# Patient Record
Sex: Female | Born: 1935 | Race: Black or African American | Hispanic: No
Health system: Southern US, Community
[De-identification: ages and names within clinical notes are randomized; demographics above are authoritative.]

---

## 2010-01-24 ENCOUNTER — Inpatient Hospital Stay: Payer: Self-pay | Admitting: Internal Medicine

## 2010-02-06 ENCOUNTER — Emergency Department: Payer: Self-pay | Admitting: Emergency Medicine

## 2010-02-20 ENCOUNTER — Ambulatory Visit: Payer: Self-pay | Admitting: Internal Medicine

## 2010-08-22 ENCOUNTER — Inpatient Hospital Stay: Payer: Self-pay | Admitting: Family Medicine

## 2011-02-01 ENCOUNTER — Inpatient Hospital Stay: Payer: Self-pay | Admitting: Family Medicine

## 2014-06-10 ENCOUNTER — Ambulatory Visit: Payer: Self-pay | Admitting: Family Medicine

## 2014-07-06 ENCOUNTER — Inpatient Hospital Stay: Payer: Self-pay | Admitting: Internal Medicine

## 2014-07-06 LAB — URINALYSIS, COMPLETE
BACTERIA: NONE SEEN
Bilirubin,UR: NEGATIVE
GLUCOSE, UR: NEGATIVE mg/dL (ref 0–75)
Ketone: NEGATIVE
LEUKOCYTE ESTERASE: NEGATIVE
NITRITE: NEGATIVE
PROTEIN: NEGATIVE
Ph: 6 (ref 4.5–8.0)
RBC,UR: 146 /HPF (ref 0–5)
SPECIFIC GRAVITY: 1.015 (ref 1.003–1.030)
Squamous Epithelial: 2
WBC UR: 19 /HPF (ref 0–5)

## 2014-07-06 LAB — COMPREHENSIVE METABOLIC PANEL
AST: 137 U/L — AB (ref 15–37)
Albumin: 2.3 g/dL — ABNORMAL LOW (ref 3.4–5.0)
Alkaline Phosphatase: 139 U/L — ABNORMAL HIGH
Anion Gap: 4 — ABNORMAL LOW (ref 7–16)
BUN: 8 mg/dL (ref 7–18)
Bilirubin,Total: 1.5 mg/dL — ABNORMAL HIGH (ref 0.2–1.0)
CALCIUM: 8.3 mg/dL — AB (ref 8.5–10.1)
Chloride: 99 mmol/L (ref 98–107)
Co2: 38 mmol/L — ABNORMAL HIGH (ref 21–32)
Creatinine: 0.42 mg/dL — ABNORMAL LOW (ref 0.60–1.30)
EGFR (African American): >60
EGFR (Non-African Amer.): >60
Glucose: 109 mg/dL — ABNORMAL HIGH (ref 65–99)
OSMOLALITY: 280 (ref 275–301)
POTASSIUM: 2.7 mmol/L — AB (ref 3.5–5.1)
SGPT (ALT): 81 U/L — ABNORMAL HIGH
Sodium: 141 mmol/L (ref 136–145)
Total Protein: 6.4 g/dL (ref 6.4–8.2)

## 2014-07-06 LAB — CBC
HCT: 40.3 % (ref 35.0–47.0)
HGB: 13.1 g/dL (ref 12.0–16.0)
MCH: 31.7 pg (ref 26.0–34.0)
MCHC: 32.4 g/dL (ref 32.0–36.0)
MCV: 98 fL (ref 80–100)
PLATELETS: 211 10*3/uL (ref 150–440)
RBC: 4.12 10*6/uL (ref 3.80–5.20)
RDW: 13.9 % (ref 11.5–14.5)
WBC: 10.8 10*3/uL (ref 3.6–11.0)

## 2014-07-06 LAB — LIPASE, BLOOD: Lipase: 36 U/L — ABNORMAL LOW (ref 73–393)

## 2014-07-06 LAB — TROPONIN I

## 2014-07-07 LAB — COMPREHENSIVE METABOLIC PANEL
ALK PHOS: 116 U/L
ALT: 62 U/L
Albumin: 1.9 g/dL — ABNORMAL LOW (ref 3.4–5.0)
Anion Gap: 2 — ABNORMAL LOW (ref 7–16)
BUN: 9 mg/dL (ref 7–18)
Bilirubin,Total: 1.2 mg/dL — ABNORMAL HIGH (ref 0.2–1.0)
Calcium, Total: 7.8 mg/dL — ABNORMAL LOW (ref 8.5–10.1)
Chloride: 100 mmol/L (ref 98–107)
Co2: 38 mmol/L — ABNORMAL HIGH (ref 21–32)
Creatinine: 0.37 mg/dL — ABNORMAL LOW (ref 0.60–1.30)
EGFR (African American): >60
EGFR (Non-African Amer.): >60
Glucose: 96 mg/dL (ref 65–99)
OSMOLALITY: 278 (ref 275–301)
Potassium: 3.6 mmol/L (ref 3.5–5.1)
SGOT(AST): 81 U/L — ABNORMAL HIGH (ref 15–37)
SODIUM: 140 mmol/L (ref 136–145)
Total Protein: 5.7 g/dL — ABNORMAL LOW (ref 6.4–8.2)

## 2014-07-07 LAB — CBC WITH DIFFERENTIAL/PLATELET
BASOS ABS: 0 10*3/uL (ref 0.0–0.1)
Basophil %: 0.2 %
Eosinophil #: 0.1 10*3/uL (ref 0.0–0.7)
Eosinophil %: 0.3 %
HCT: 36.6 % (ref 35.0–47.0)
HGB: 12 g/dL (ref 12.0–16.0)
Lymphocyte #: 1.6 10*3/uL (ref 1.0–3.6)
Lymphocyte %: 8.5 %
MCH: 31.7 pg (ref 26.0–34.0)
MCHC: 32.8 g/dL (ref 32.0–36.0)
MCV: 97 fL (ref 80–100)
MONO ABS: 1.2 x10 3/mm — AB (ref 0.2–0.9)
Monocyte %: 6.6 %
NEUTROS ABS: 15.6 10*3/uL — AB (ref 1.4–6.5)
NEUTROS PCT: 84.4 %
Platelet: 190 10*3/uL (ref 150–440)
RBC: 3.78 10*6/uL — ABNORMAL LOW (ref 3.80–5.20)
RDW: 14 % (ref 11.5–14.5)
WBC: 18.5 10*3/uL — ABNORMAL HIGH (ref 3.6–11.0)

## 2014-07-08 LAB — CBC WITH DIFFERENTIAL/PLATELET
BASOS ABS: 0 10*3/uL (ref 0.0–0.1)
Basophil %: 0.3 %
EOS PCT: 3 %
Eosinophil #: 0.4 10*3/uL (ref 0.0–0.7)
HCT: 34 % — ABNORMAL LOW (ref 35.0–47.0)
HGB: 11 g/dL — ABNORMAL LOW (ref 12.0–16.0)
Lymphocyte #: 2.3 10*3/uL (ref 1.0–3.6)
Lymphocyte %: 19 %
MCH: 31.8 pg (ref 26.0–34.0)
MCHC: 32.5 g/dL (ref 32.0–36.0)
MCV: 98 fL (ref 80–100)
Monocyte #: 0.9 x10 3/mm (ref 0.2–0.9)
Monocyte %: 7.3 %
NEUTROS ABS: 8.4 10*3/uL — AB (ref 1.4–6.5)
NEUTROS PCT: 70.4 %
Platelet: 177 10*3/uL (ref 150–440)
RBC: 3.47 10*6/uL — ABNORMAL LOW (ref 3.80–5.20)
RDW: 13.9 % (ref 11.5–14.5)
WBC: 11.9 10*3/uL — AB (ref 3.6–11.0)

## 2014-07-08 LAB — URINE CULTURE

## 2014-07-09 LAB — CBC WITH DIFFERENTIAL/PLATELET
BASOS ABS: 0.1 10*3/uL (ref 0.0–0.1)
Basophil %: 0.6 %
Eosinophil #: 0.4 10*3/uL (ref 0.0–0.7)
Eosinophil %: 4.9 %
HCT: 33.9 % — ABNORMAL LOW (ref 35.0–47.0)
HGB: 11.1 g/dL — AB (ref 12.0–16.0)
LYMPHS ABS: 2.1 10*3/uL (ref 1.0–3.6)
Lymphocyte %: 25.1 %
MCH: 32.3 pg (ref 26.0–34.0)
MCHC: 32.9 g/dL (ref 32.0–36.0)
MCV: 98 fL (ref 80–100)
MONO ABS: 0.8 x10 3/mm (ref 0.2–0.9)
Monocyte %: 9.4 %
NEUTROS ABS: 5.1 10*3/uL (ref 1.4–6.5)
Neutrophil %: 60 %
Platelet: 166 10*3/uL (ref 150–440)
RBC: 3.45 10*6/uL — ABNORMAL LOW (ref 3.80–5.20)
RDW: 14 % (ref 11.5–14.5)
WBC: 8.4 10*3/uL (ref 3.6–11.0)

## 2014-07-10 LAB — CULTURE, BLOOD (SINGLE)

## 2014-07-11 LAB — CULTURE, BLOOD (SINGLE)

## 2014-07-13 LAB — THROAT CULTURE

## 2014-08-10 ENCOUNTER — Ambulatory Visit: Payer: Self-pay | Admitting: Family Medicine

## 2014-11-09 DEATH — deceased

## 2014-12-01 NOTE — Discharge Summary (Signed)
PATIENT NAME:  Jennifer Bautista, Jennifer Bautista MR#:  [id number] DATE OF BIRTH:  01-May-1930  DATE OF ADMISSION:  07/06/2014 DATE OF DISCHARGE:  07/10/2014  ADMITTING DIAGNOSIS:  Acute gastritis.   DISCHARGE DIAGNOSES: 1.  Suspected acute gastroenteritis of unclear etiology.  2.  Sepsis due to Escherichia coli and Klebsiella species.  3.  Questionable right lower lobe pneumonia, suspected aspiration.  4.  Acute respiratory failure, resolved.  5.  Left breast cancer with metastasis.  6.  Hypotension.  7.  Gastroesophageal reflux disease.  8.  History of morbid obesity with associated nausea and associated skin damage under breasts and pannus.   9.  Denuded skin and excoriations on sacrum, left posterior thigh, and mid lower back.   DISCHARGE CONDITION:  Stable.   DISCHARGE MEDICATIONS:  The patient is to continue omeprazole 10 mg p.o. daily, Keflex 500 mg every 8 hours for 12 more days to complete a 14-day course, benzocaine/menthol topical lozenges every 2 hours as needed. The patient is not to take metoprolol or aspirin unless recommended by primary care physician.   HOME OXYGEN:  None.   DIET:  A 2-gram salt, low-fat, low-cholesterol, low-calorie diet. The patient was advised to avoid soft drinks or Gatorade, as all of those drinks have quite a lot of glucose sugars. Diet consistency:  Mechanical soft with thin liquids and aspiration precautions. The patient was advised to sit fully upright with any oral intake and eat and drink slowly small single bites and sips. Medications should be given in puree as necessary for easier swallowing.   ACTIVITY LIMITATIONS:  As tolerated.   REFERRAL:  To hospice at home.    FOLLOWUP APPOINTMENTS:  With [facility] medical director in 1 to 2 days after discharge.   CONSULTANTS:  Care management, social work, wound care, Dr. Sampson Goon.   RADIOLOGIC STUDIES:  Chest x-ray, portable, single view, on 07/06/2014, revealed right perihilar infiltrate versus mass.  Followup chest x-ray to demonstrate right perihilar fullness, concerning for underlying mass or infiltrate, also pulmonary vasculr congestion.   Ultrasound of abdomen, limited survey, on 07/06/2014, showed post cholecystectomy. No evidence of biliary dilatation.   Chest x-ray, portable, single view, on 07/09/2014, revealed right perihilar fullness, may simply represent prominence of the vascular structures in the setting of low inspiratory volumes. Underlying mass or perihilar infiltrate was not excluded. Dedicated PA and lateral chest x-ray may be helpful in further evaluation. Atherosclerotic and tortuous thoracic aorta, low inspiratory volumes, and pulmonary vascular congestion without overt edema were noted.   CT of the chest with contrast on 07/09/2014, revealed a large mass in the anterior aspect of the left breast, not fully imaged on the study. Overlying marked skin thickening reflecting inflammatory change. This is compatible with primary breast malignancy. Mild soft tissue inflammation extends to the midline anterior chest wall. Additional scattered nodules more superiorly within the left breast are concerning for local spread of malignancy. Significantly enlarged and heterogeneous nodal metastasis in the left axilla measuring 5.2 x 3.8 cm with associated calcification and irregularity. Partially calcified nodes in the right axilla measuring up to 2.3 cm and short axis suspicious for contralateral nodal metastasis. Scattered bilateral pulmonary nodules were seen measuring up to 1.3 cm in size, suspicious for metastatic disease in the lungs. Right hilar prominence reflects an enlarged 1.6 cm right hilar node. Additional enlarged mediastinal nodes were seen, also suspicious for metastatic disease. Focal bronchiectasis in the right lower lung lobe with mild associated scarring. This may reflect sequelae of prior infection. Mild  bibasilar atelectasis was noted. Scattered coronary artery calcification was  noted. Nonspecific soft tissue inflammation along the right lateral chest wall. Scattered small, nonobstructing bilateral renal stones measuring up to 4 mm in size. A 5 cm left renal cyst was seen. Scattered diverticulosis along the splenic flexure of the colon.   Bilateral lower extremity Dopplers on 07/10/2014, showed no evidence of deep vein thrombosis bilaterally.   HOSPITAL COURSE:  The patient is an 81 year old African-American female with a history of breast carcinoma, who refused care in the past, who presented to the hospital with complaints of nausea, vomiting, and abdominal pain of right upper quadrant. Please refer to Dr. Rob Hickman admission on 07/06/2014. Apparently, the patient had been sick over the past 2 days with nausea, vomiting, and abdominal pain. She was brought to the Emergency Room. On arrival to the Emergency Room, she was hypoxic with oxygen saturation of 80%. She was complaining of right upper quadrant abdominal pain; however, no abnormalities were noted. Temperature was found to be 99.6, pulse 100, respiration rate 18 to 22, blood pressure 110/72, and pulse oximetry was 95% on 2 liters of oxygen through nasal cannula. Physical exam revealed left breast mass. Right upper quadrant tenderness was noted. Crackles on the right side were also noted on lung exam.   The patient's laboratory data done on arrival to the Emergency Room on 07/06/2014, showed a glucose level of 109. The patient's potassium level was low at 2.7. Bicarbonate level was elevated at 38. Otherwise, BMP was unremarkable. The patient's lipase level was found to be low at 36. Liver enzymes were found to be abnormal with albumin level of 2.8, total bilirubin 1.5, alkaline phosphatase 139, AST 137, and ALT of 81. Troponin was normal at less than 0.02. White blood cell count was normal at 10.8, hemoglobin 13.1, and platelet count was 211,000. The patient had 2 blood cultures taken in the Emergency Room, which were positive.  Klebsiella pneumoniae species was found to be in 1 out of 4 bottles, and Escherichia coli was found to be in 2 out of 4 bottles. Bacteria was sensitive to all antibiotics, but Klebsiella was resistant to ampicillin. Urinalysis showed 146 red blood cells and 19 white blood cells, although the patient's urine culture did not show any growth. EKG showed sinus tachycardia at 101 beats per minute, left axis deviation, and inferior as well as anterior infarct with no acute ST-T changes.   The patient was admitted to the hospital for further evaluation. She was initiated on broad-spectrum antibiotic therapy because of concerns of pneumonia. When her blood cultures came back positive, the patient had consultation by Dr. Sampson Goon, infectious disease specialist, on 07/08/2014. Dr. Sampson Goon felt that the patient should have a speech therapist evaluation for swallowing because of concerns of aspiration and right lower lobe infiltrate. He also felt that the patient's presentation could have been related to possible billiary event or that she was bacteremic from pneumonia. He recommended continuing antibiotic therapy and also check for Clostridium difficile, especially if she has ongoing diarrhea. Also, if she worsens clinically or her LFTs increase again, he recommended getting a CT scan of the abdomen and pelvis, as ultrasound is not an adequate study. Since the patient had no more recurrence of her nausea, vomiting, or diarrheal stool, we were not able to get any stool for Clostridium difficile. We evaluated the patient's swallowing, and dietary recommended aspiration precautions. The patient was initiated on broad-spectrum antibiotics during therapy initially for her sepsis; however, later on, the  patient's antibiotic was changed to Keflex. The patient is to continue Keflex for 12 more days to complete a 14-day course.   On the day of discharge, 07/10/2014, she is afebrile with temperature of 98, pulse 96, respiration  rate 18 to 24, blood pressure fluctuating from 92 to 107 systolic and 50 to 60 diastolic, and oxygen saturations 90 to 93% on room air at rest.   In regards to breast cancer with metastasis, the patient was consulted by palliative care, and since she did not want to have any further investigation and treatment by oncologist, hospice at home was arranged for her upon discharge.   In regards to hypotension, the patient is to hold her blood pressure medications and restart them when her blood pressure is elevated. I suspect the patient's hypotension could have been related to poor p.o. intake as well as some intermittent diarrheal stools.   For gastroesophageal reflux disease as well as history of nausea and vomiting, the patient is to continue omeprazole. I am holding aspirin therapy.   The patient was noted to have candidiasis associated skin damage under her breasts as well as pannus as well as denuded skin and excoriations of sacrum, left posterior thigh, and mid and lower back. She was seen by wound care specialist and recommended to have her skin cleansed under the breasts as well as abdominal pannus with soap and water. Apply therapeutic linen such as pillow cases under breasts and abdominal pannus and change daily. In regards to lesions of the sacrum as well as left posterior thigh and mid and lower back, wound care recommended cleansing lesions with soap and water and pat gently to dry. Apply Allevyn silicone border foam dressing to be reapplied every 5 days and as needed for soiling. The patient will be followed and cared for by hospice and nursing staff.   TIME SPENT:  40 minutes.     ____________________________ Katharina Caperima Lennix Kneisel, MD rv:nb D: 07/10/2014 17:05:20 ET T: 07/11/2014 01:06:06 ET JOB#: [id number]  cc: Katharina Caperima Andi Mahaffy, MD, <Dictator> Zora Glendenning MD ELECTRONICALLY SIGNED 08/07/2014 20:46

## 2014-12-01 NOTE — H&P (Signed)
PATIENT NAME:  Jennifer Bautista, ARAUZ MR#:  [id number] DATE OF BIRTH:  [birth year]  DATE OF ADMISSION:  07/06/2014  PRIMARY CARE PHYSICIAN: Marisue Ivan, MD  REFERRING EMERGENCY ROOM PHYSICIAN: Kathreen Devoid. Paduchowski, MD   CHIEF COMPLAINT: Nausea, vomiting, abdominal pain in the right upper quadrant.   HISTORY OF PRESENT ILLNESS: Patient is an 81 year old African American female brought into the ED by her daughter with a chief complaint of nausea, vomiting, and right upper quadrant abdominal pain. She reports that she has been sick for the past 2 days with the above symptoms. Came into the ED. On room air, the patient was saturating at 80%. She was placed on 2 L of oxygen and her pulse oximetry went up to 95%. Portable chest x-ray has revealed right perihilar infiltrate versus mass. As the patient was complaining of right upper quadrant abdominal pain, ultrasound was done. It shows post cholecystectomy. No evidence of biliary dilatation. During my examination, the patient is resting comfortably. According to the daughter at bedside, she has left-sided breast cancer and not seeking any medical attention regarding that. The patient is bedbound, according to the patient's daughter.   PAST MEDICAL HISTORY: Left-sided breast cancer with infiltration and metastasis to skin. History of hypertension, GERD, obesity. History of hiatal hernia.   PAST SURGICAL HISTORY: Status post cholecystectomy, post hiatal hernia repair.   PSYCHOSOCIAL HISTORY: Lives at home with daughter. No history of smoking, alcohol or illicit drug usage.   FAMILY HISTORY: No history of coronary artery disease or cancer in her family.   HOME MEDICATIONS: Omeprazole 20 mg 1 capsule p.o. once daily, metoprolol tartrate 25 mg 1 capsule p.o. 2 times a day, aspirin 81 mg once daily.  REVIEW OF SYSTEMS: CONSTITUTIONAL: Denies any fever, fatigue. Denies any weight loss. Denies any weakness.  EYES: Denies blurry vision, double vision.  ENT:  Denies epistaxis, discharge, or hearing loss.  RESPIRATORY: Denies any cough, wheezing, or hemoptysis.  CARDIOVASCULAR: Denies any chest pain, orthopnea. Chronic edema. GASTROINTESTINAL: Complaining of nausea, vomiting, and right-sided abdominal pain. GENITOURINARY: No dysuria or hematuria. ENDOCRINE: Denies polyuria, nocturia, or thyroid problems.  GYNECOLOGIC AND BREASTS: Has a history of left-sided breast cancer. HEMATOLOGIC AND LYMPHATIC: Denies any anemia.  INTEGUMENTARY: Denies any rash but her breast cancer has infiltrated to the skin.  MUSCULOSKELETAL: Denies any pain in her neck or back. NEUROLOGIC: Denies any numbness or weakness.  PSYCHIATRIC: No anxiety or insomnia.   PHYSICAL EXAMINATION: VITAL SIGNS: Temperature 99.6, pulse 100, respirations 18 to 22, blood pressure 110/72, pulse oximetry 95% on 2 L.  GENERAL APPEARANCE: Not in acute distress. Morbidly obese.  HEENT: Normocephalic, atraumatic. Pupils are equally reactive to light and accommodation. No scleral icterus. No conjunctival injection. No sinus tenderness. Dry mucous membranes.  NECK: Supple. No JVD. No thyromegaly. Range of motion is intact.  LUNGS: Clear to auscultation bilaterally. No accessory muscle use. Positive crackles on the right side.  CARDIOVASCULAR: S1, S2 normal. Regular rate and rhythm. No murmurs.  GASTROINTESTINAL: Soft. Bowel sounds are positive in all 4 quadrants. Nondistended. Positive right upper quadrant tenderness. No masses felt. Morbidly obese.  NEUROLOGIC: Awake and alert, oriented x 2 to 3. Following verbal commands. Motor and sensory are intact. Reflexes are 2+.  SKIN: Warm to touch. Decreased turgor. No rashes. No lesions. Left breast cancer is infiltrated to the skin with ulcers. Left breast is enormously enlarged with infiltration of breast cancer to the skin with foul-smelling ulcers.  BACK: Skin looks dry with desquamation. No decubitus ulcers noticed.  EXTREMITIES: Trace edema. No  cyanosis, no clubbing.  PSYCHIATRIC: Flat mood and affect.  LABORATORY AND IMAGING STUDIES: Ultrasound of the abdomen status post cholecystectomy. No evidence of biliary dilatation. Chest x-ray, portable: Right perihilar infiltrate versus mass. Followup PA and lateral chest x-ray to demonstrate clearing suggested. Glucose is 109, BUN normal, creatinine 1.42, sodium normal,  chloride 99, CO2 of 38. GFR greater than 60. Anion gap is normal. Serum osmolality 280. Calcium 8.3. Lipase is 36, total protein 6.4, albumin 2.3. Bilirubin total is 1.5. Alkaline phosphatase 139. AST is 137, ALT 81. Troponin less than 0.02. WBC is normal. Hemoglobin, hematocrit, platelet counts are normal.   URINALYSIS: Yellow in color, clear in appearance, nitrites and leukocyte esterase are negative.   ASSESSMENT AND PLAN: An 81 year old African American female brought into the Emergency Department by her daughter for intractable nausea, vomiting, and right upper quadrant abdominal pain for the past 2 days. Her pulse oximetry dropped down to 80% on room air and she was saturating 95% on 2 L. Chest x-ray revealed right-sided pneumonia. 1.  Acute gastritis with right upper quadrant abdominal pain with elevated liver function tests status post cholecystectomy, probably from right-sided pneumonia versus choledocholithiasis other differential diagnosis, but no biliary duct dilatation on ultrasound. We will provide her gastrointestinal prophylaxis. We will provide intravenous fluids, antiemetics. Will get a surgery consult if needed.  2.  Treat pneumonia with antibiotics.  3.  Right-sided pneumonia. Will provide her intravenous levofloxacin. Will get a sputum culture and sensitivity.  4.  Acute hypoxic respiratory failure, probably from right-sided pneumonia. Will provide oxygen supplementation.  5.  Left-sided breast cancer which has metastasized, with skin infiltration. The patient does not want to seek any oncology consult.  6.   Hypertension. Blood pressure is low normal. Will hold off on her medications.  7.  Gastroesophageal reflux disease. Will provide her gastrointestinal prophylaxis.  8.  Morbidly obese.  9.  Chronically bedbound.  10.  We will provide gastrointestinal and deep vein thrombosis prophylaxis.  CODE STATUS: She is do not resuscitate. Daughter is the medical power-of-attorney.  Plan of care discussed in detail with the patient and her daughter at bedside. They both verbalized understanding of the plan.  TOTAL TIME SPENT ON ADMISSION: 50 minutes.  The patient was Dr. Sherryll BurgerLinthavong's patient, but they want to see a new primary care physician and does not want to continue with Dr. Burnadette PopLinthavong in future.   ____________________________ Ramonita LabAruna Myah Guynes, MD ag:ST D: 07/06/2014 22:06:55 ET T: 07/06/2014 22:49:41 ET JOB#: [id number]  cc: Ramonita LabAruna Justyce Yeater, MD, <Dictator> Marisue IvanKanhka Linthavong, MD Ramonita LabARUNA Monee Dembeck MD ELECTRONICALLY SIGNED 07/26/2014 16:47

## 2014-12-05 NOTE — Consult Note (Signed)
PATIENT NAME:  Jennifer Bautista, Jennifer Bautista MR#:  [id number] DATE OF BIRTH:  January 18, 1930  DATE OF CONSULTATION:  07/07/2014  REQUESTING PHYSICIAN:  Dr. Sherryll Burger. CONSULTING PHYSICIAN:  Stann Mainland. Sampson Goon, MD  REASON FOR CONSULTATION: Fever and pneumonia and bacteremia.   HISTORY OF PRESENT ILLNESS: This is an 81 year old female who has a history of breast cancer with metastases to skin not seeking treatment, as well as hypertension, obesity, GERD, and hiatal hernia. She was admitted November 27 with nausea, vomiting, and abdominal pain in her right upper quadrant. Chest x-ray showed possible pneumonia. She was also hypoxic on admission. She did have mild elevation in her LFTs although ultrasound was negative. Since admission, her nausea, vomiting, and diarrhea have improved; however, she has bacteremia in 1 of 2 blood cultures.   PAST MEDICAL HISTORY:  1. Breast cancer with infiltration or metastases to skin.  2. Hypertension.  3. GERD.  4. Morbid obesity.  5. History of hiatal hernia.   PAST SURGICAL HISTORY: Cholecystectomy and hiatal hernia repair.   SOCIAL HISTORY: Lives at home. No tobacco, alcohol, or drugs. She apparently is largely bedbound.   FAMILY HISTORY: Noncontributory.   REVIEW OF SYSTEMS: Eleven systems reviewed and negative except as per history of present illness.   ANTIBIOTICS SINCE ADMISSION: Include levofloxacin begun November 27.   PHYSICAL EXAMINATION:  VITAL SIGNS: Temperature 98.7, pulse 105, blood pressure 117/74, respirations 18, saturation 98% on 2 L.  GENERAL: She is morbidly obese, lying in bed in no acute distress.  HEENT: Pupils equal, round, reactive to light and accommodation. Extraocular movements are intact. Sclerae are anicteric.  OROPHARYNX: Clear.  NECK: Supple.  HEART: Regular.  LUNGS: Have coarse breath sounds bilateral bases.  ABDOMEN: Obese, soft, mild tender to palpation right upper quadrant.  EXTREMITIES: No clubbing, cyanosis, or edema.  NEUROLOGIC:  She is alert and oriented x 3. Grossly nonfocal neurologic exam.   DATA: Micro: Blood cultures 2 of 2 are growing gram-negative cocci bacilli from November 27. Urine culture November 27 is negative. White blood count on admission was 10.8, currently 18.5; hemoglobin 12.0; platelets 190,000. Urinalysis showed 19 white cells. LFTs initially showed alkaline phosphatase 139, down to 116; AST 137, down to 81; ALT 81, down to 62; bilirubin was 1.5 now it is 1.2; total protein 5.7; albumin 1.9. Renal function shows creatinine of 0.37. Lipase was 36. Imaging: Ultrasound of the abdomen was a poor study but reveals gallbladder absent. No evidence of biliary dilation. Chest x-ray showed perihilar infiltrate versus mass on the right.   IMPRESSION: An 81 year old female with history of morbid obesity largely bed admitted with nausea, vomiting, and abdominal pain for 2 days. She then was found to be hypoxic and chest x-ray shows right perihilar infiltrate or mass. Clinically, she is no longer having nausea or vomiting. Chest x-ray shows pneumonia and blood cultures 2 of 2 are positive.   I suspect either she had a biliary event and is bacteremic from that or had some pneumonia. Urinalysis is relatively unimpressive.   RECOMMENDATIONS:  1. Double cover for gram negatives at this point with Zosyn and levofloxacin until further identification of the organism.  2. If she worsens clinically or her LFTs increase again would suggest CT of her abdomen and pelvis, as the ultrasound was a poor study.  3. Consider speak and swallow evaluation to evaluate for possible aspiration given the right lower lobe infiltrate in the setting of recent gastroenteritis and nausea and vomiting.  Thanks for the consult. I will be  glad to follow with you.    ____________________________ Stann Mainlandavid P. Sampson GoonFitzgerald, MD dpf:bm D: 07/07/2014 22:44:42 ET T: 07/07/2014 23:28:46 ET JOB#: [id number]  cc: Stann Mainlandavid P. Sampson GoonFitzgerald, MD, <Dictator> Seichi Kaufhold  Sampson GoonFITZGERALD MD ELECTRONICALLY SIGNED 08/12/2014 21:18

## 2016-01-02 IMAGING — US ABDOMEN ULTRASOUND LIMITED
1 series · 14 of 25 positions shown · non-contrast
Comparison: 02/01/2011

CLINICAL DATA: Right upper quadrant pain.  Post cholecystectomy.

EXAM:
US ABDOMEN LIMITED - RIGHT UPPER QUADRANT

[Series 1: abdomen ultrasound limited · 0.27mm/px · 14 of 25 slices shown]
[im 1/25]
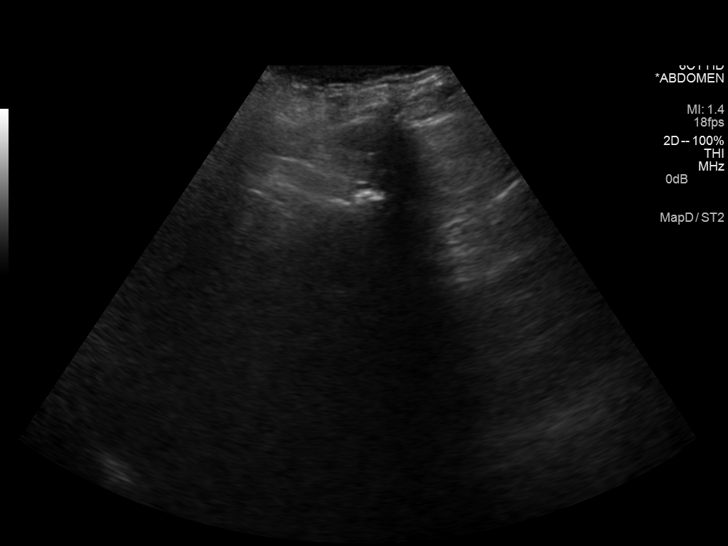
[im 3/25]
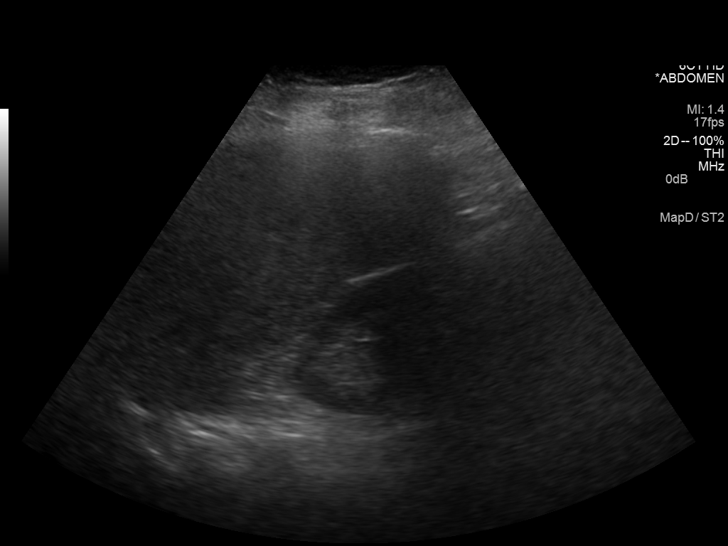
[im 5/25]
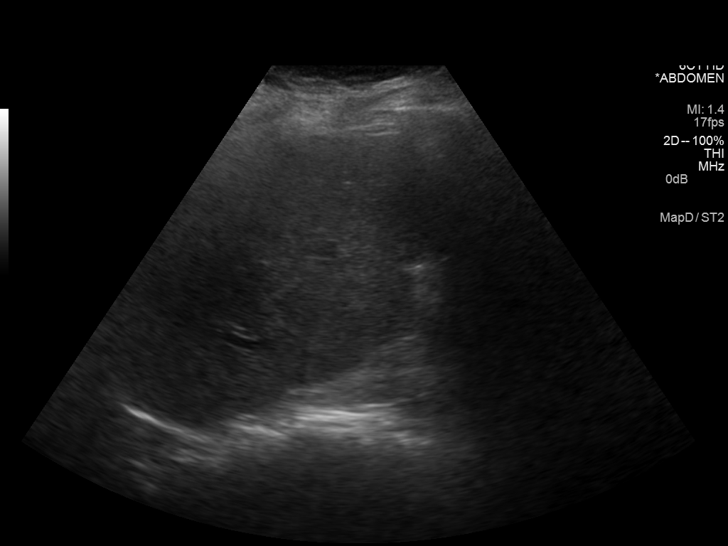
[im 7/25]
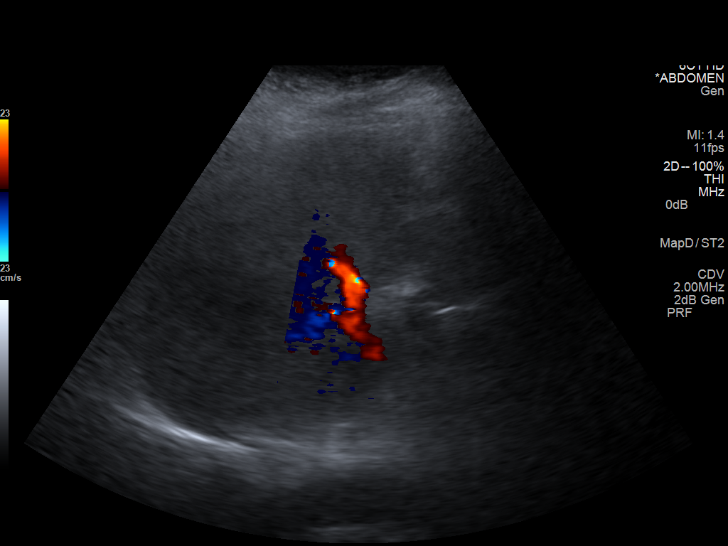
[im 9/25]
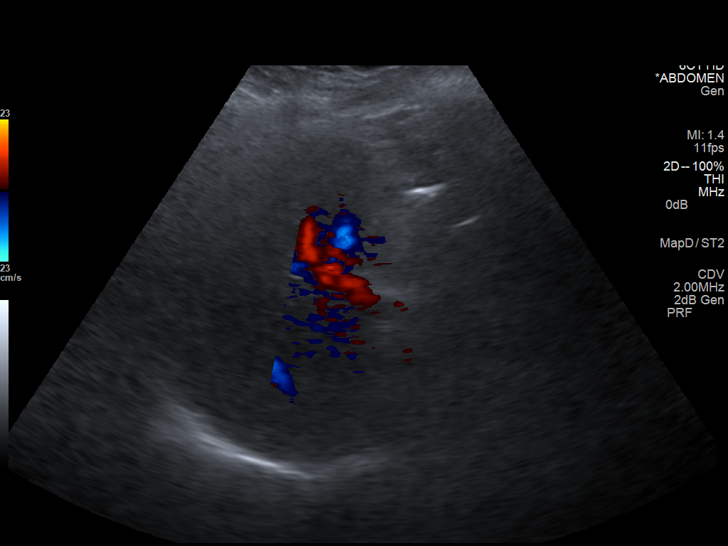
[im 10/25]
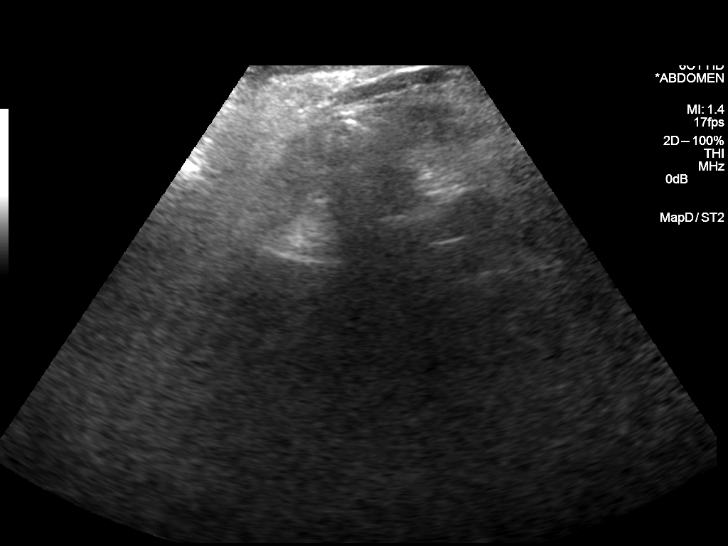
[im 12/25]
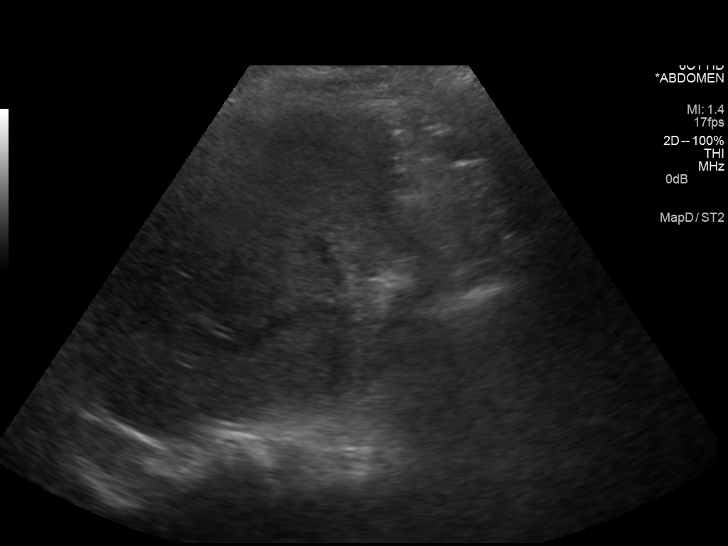
[im 14/25]
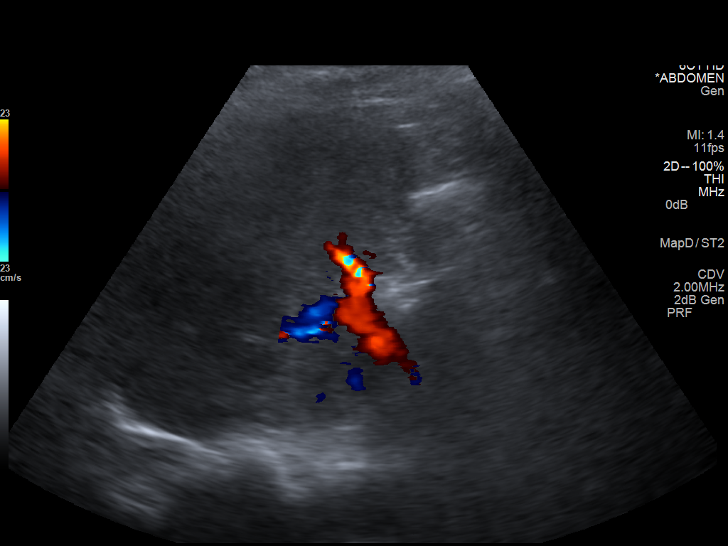
[im 16/25]
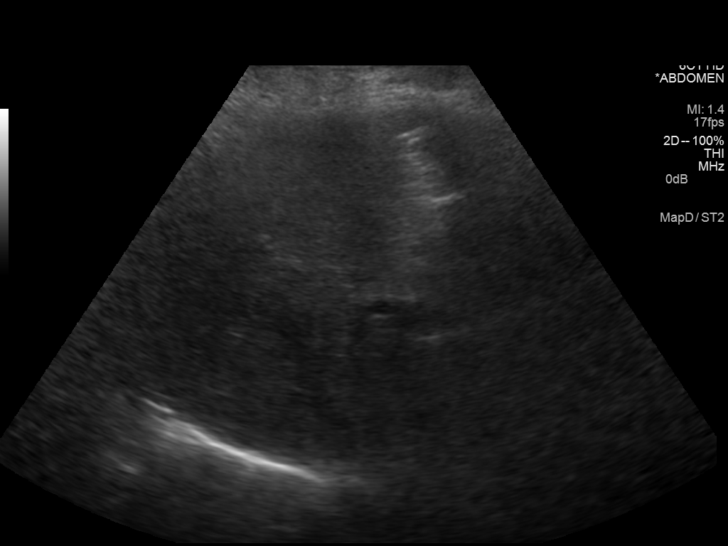
[im 17/25]
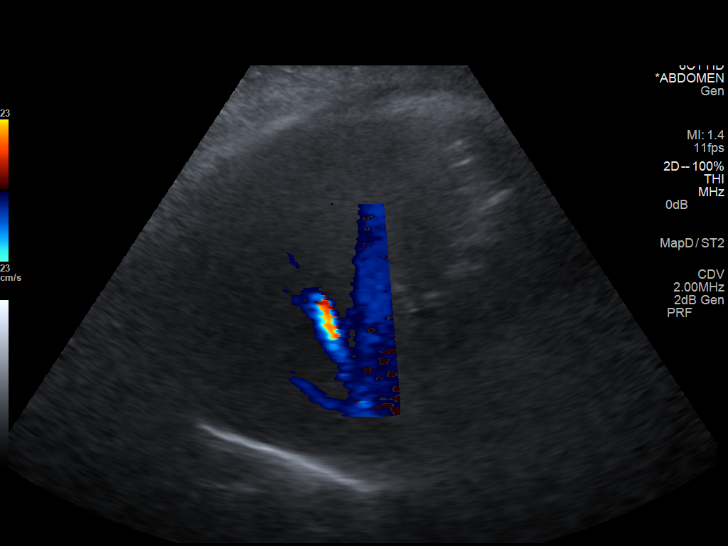
[im 19/25]
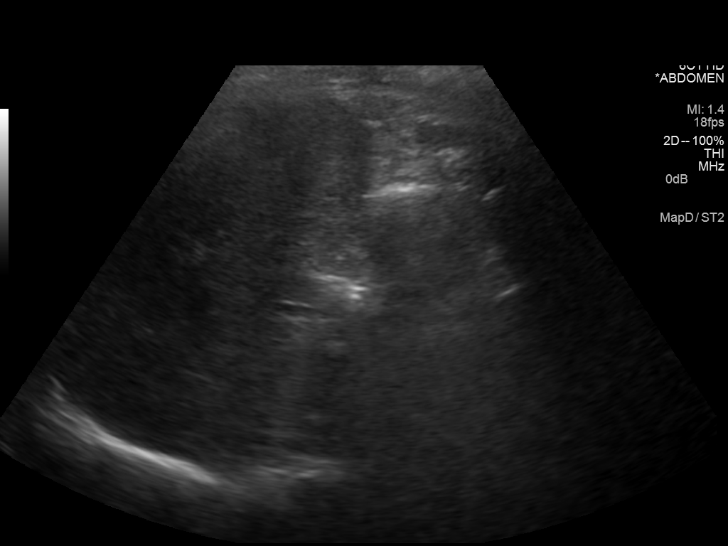
[im 21/25]
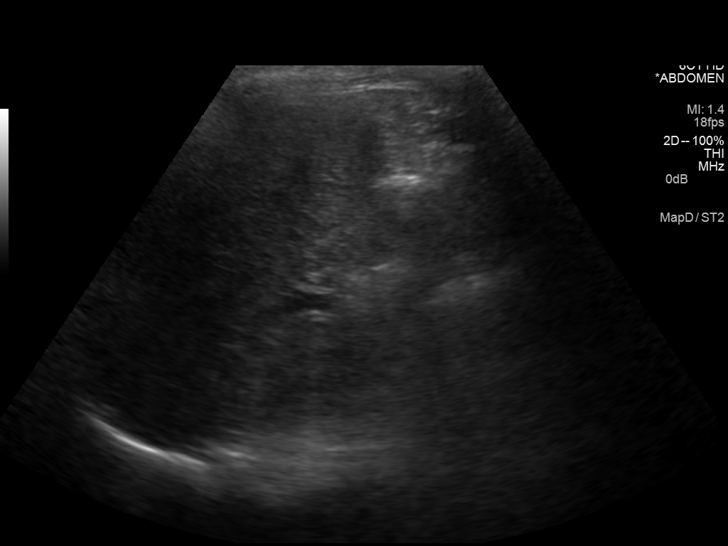
[im 23/25]
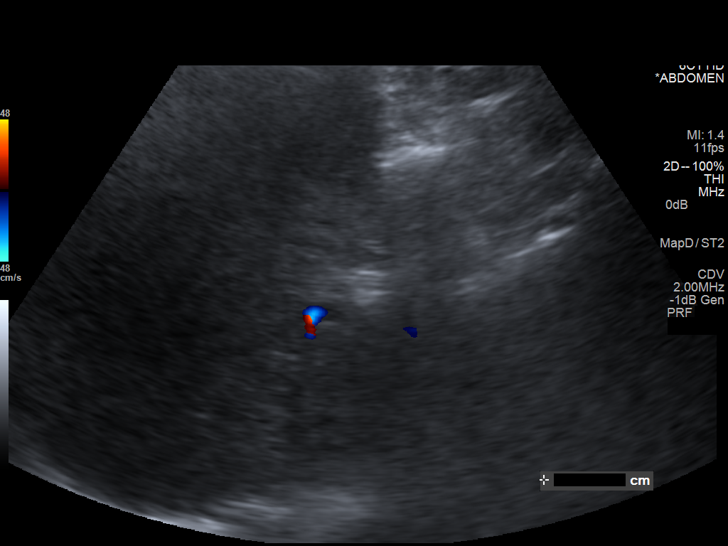
[im 25/25]
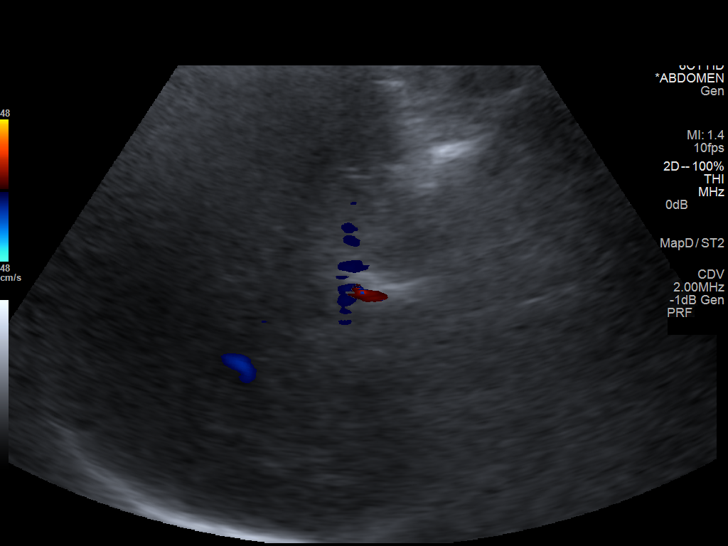

[14 of 25 positions shown; findings below may reference images not displayed]

FINDINGS: Gallbladder:

Surgically removed.

Common bile duct:

Diameter: 5 mm

Liver:

Limited evaluation of the liver parenchyma due to body habitus. No
significant intrahepatic biliary dilatation.
IMPRESSION: Post cholecystectomy.  No evidence for biliary dilatation.

## 2016-01-05 IMAGING — CT CT CHEST W/ CM
2 of 3 series · 13 of 36 positions shown, 16 images · IV contrast (isovue)
Comparison: Chest radiograph performed earlier today at [DATE] p.m.

CLINICAL DATA: Acute onset of nausea, vomiting and abdominal pain
for 2 days. Hypoxia. Right perihilar infiltrate or mass noted on
chest radiograph. Initial encounter.

EXAM:
CT CHEST WITH CONTRAST
TECHNIQUE: Multidetector CT imaging of the chest was performed during
intravenous contrast administration.
CONTRAST:  75 mL of Isovue 300 IV contrast

[Series 4: routine chest with 2 · axial · 0.75mm/px · z∈[-744,-514]mm · 10 of 56 slices shown, 13 images]
[im 5/56  mediastinal]
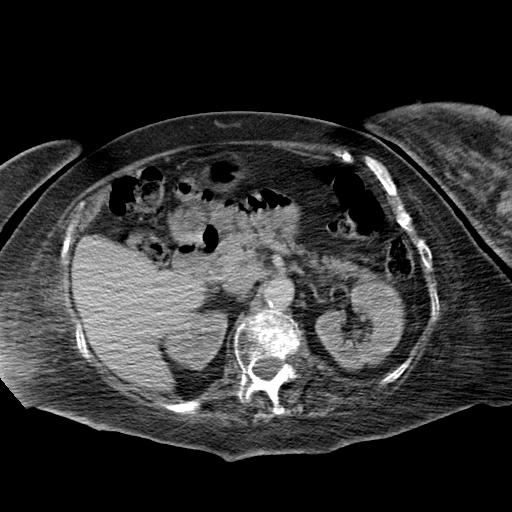
[im 5/56  lung]
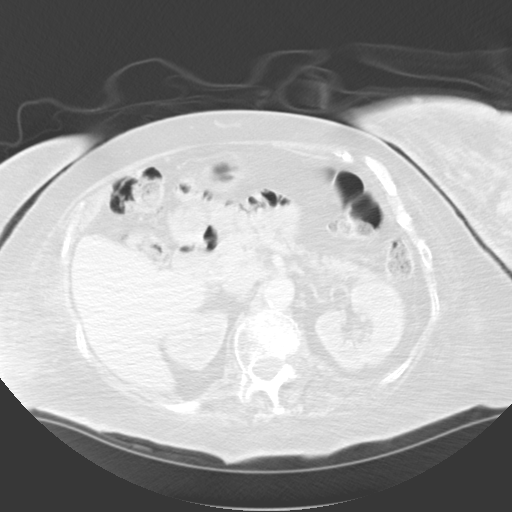
[im 9/56  lung]
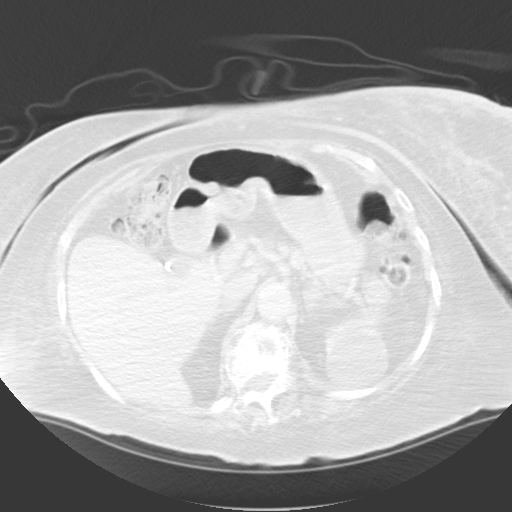
[im 15/56  lung]
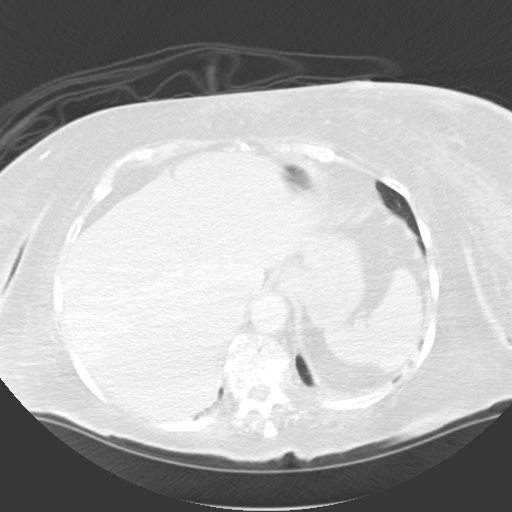
[im 21/56  lung]
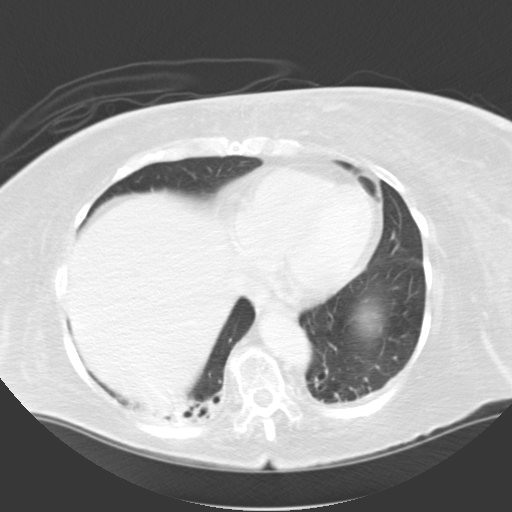
[im 25/56  mediastinal]
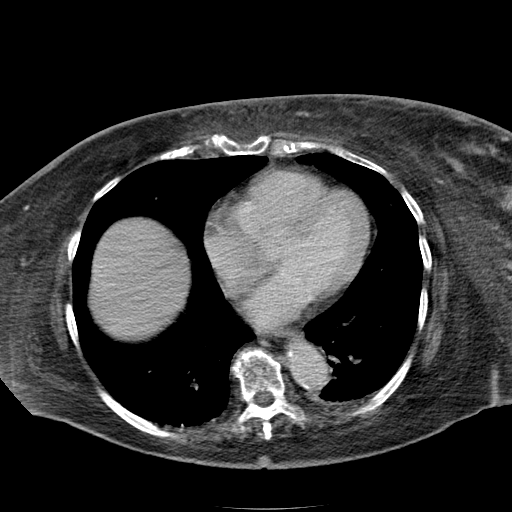
[im 25/56  lung]
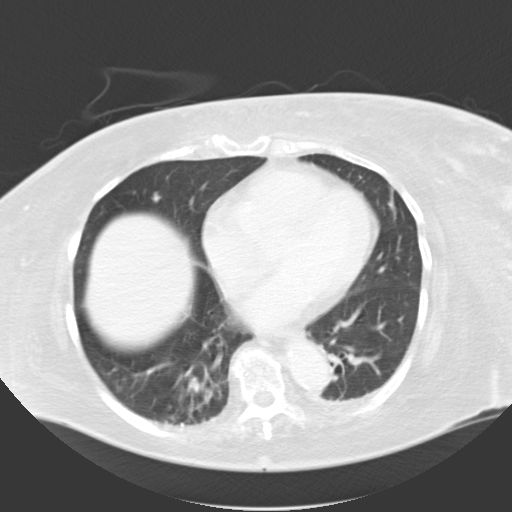
[im 31/56  lung]
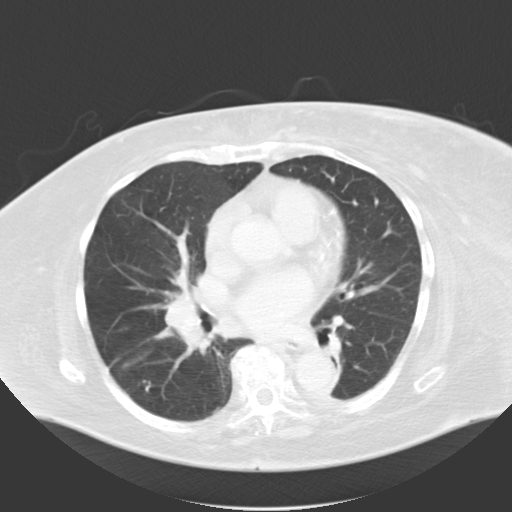
[im 35/56  lung]
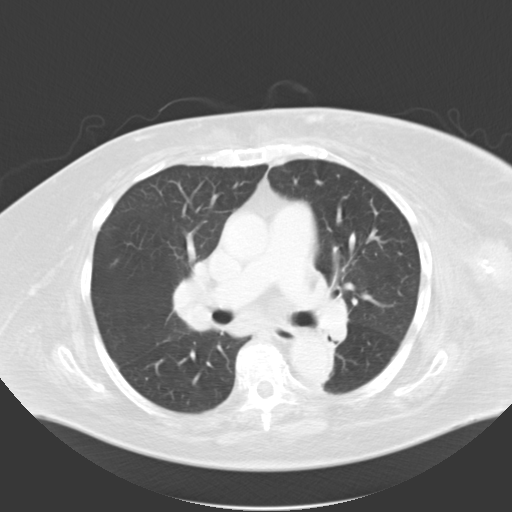
[im 41/56  lung]
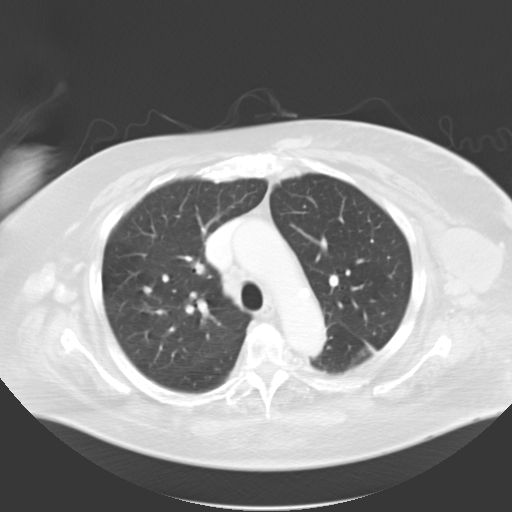
[im 47/56  mediastinal]
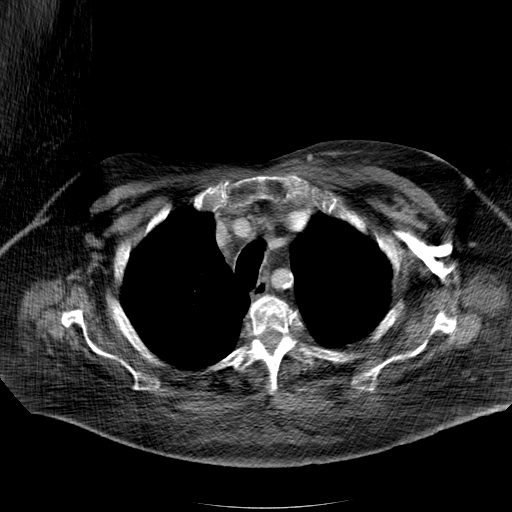
[im 47/56  lung]
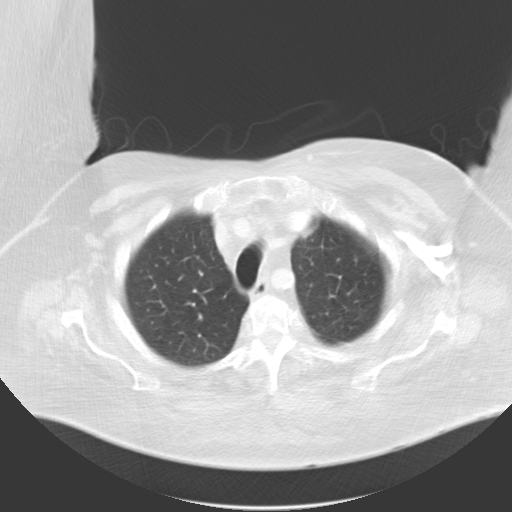
[im 51/56  lung]
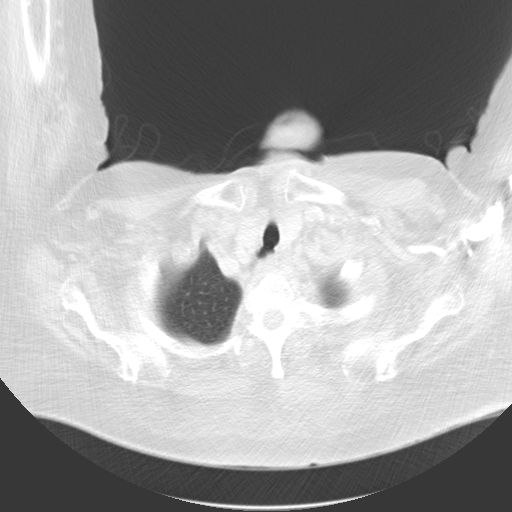

[Series 7: cor routine chest with · coronal · 0.59mm/px · 3 of 118 slices shown]
[im 24/118  lung]
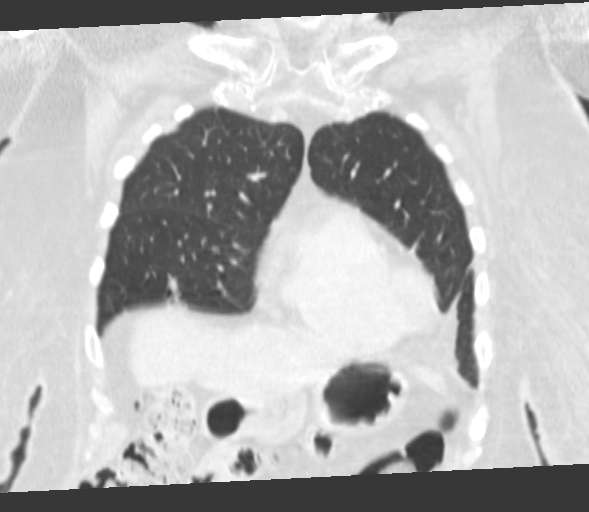
[im 47/118  lung]
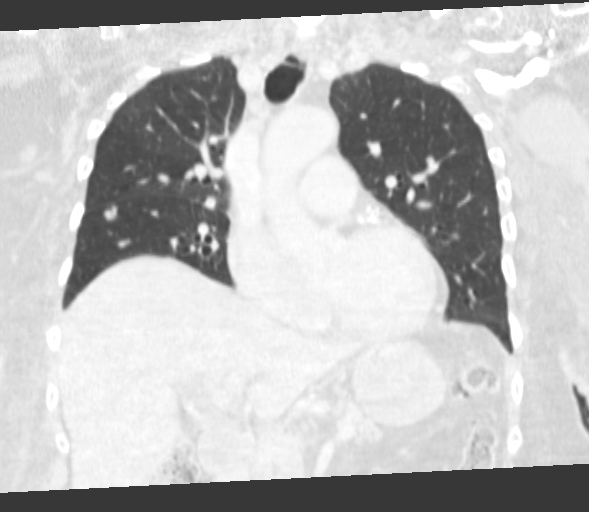
[im 71/118  lung]
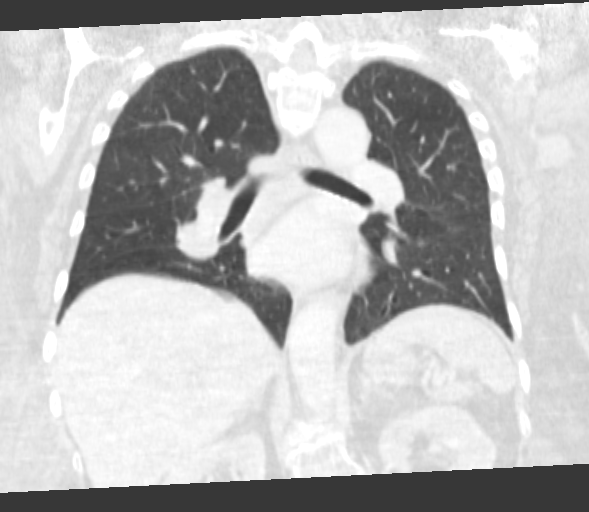

[13 of 36 positions shown; findings below may reference images not displayed]

FINDINGS: There is a large mass at the inferior aspect of the left breast, not
fully characterized on this study. Overlying marked skin thickening
is seen, compatible with inflammatory change. This is highly
suspicious for primary breast malignancy. Additional scattered
nodules are seen more superiorly within the left breast, and there
is a significantly enlarged heterogeneous nodal metastasis at the
left axilla, measuring 5.2 x 3.8 cm, with associated calcification
and irregularity.

There are also partially calcified nodes at the right axilla,
measuring up to 2.3 cm in short axis, suspicious for contralateral
nodal metastases. Soft tissue inflammation extends centrally to the
midline anterior chest wall.

Scattered bilateral pulmonary nodules are seen, measuring up to
cm in size, suspicious for metastatic disease to the lungs. There is
focal bronchiectasis within the right lower lobe, with mild
associated scarring. This may reflect sequelae of prior infection.
Mild bibasilar atelectasis is seen.

No significant pleural effusion or pneumothorax is seen at this
time.

The visualized right hilar prominence on radiograph reflects both
vasculature and an enlarged 1.6 cm right hilar node. There is also a
1.8 cm right azygoesophageal recess node, and a 1.5 cm right
paratracheal node. These are suspicious for metastatic disease.
Scattered coronary artery calcification is noted. The great vessels
are grossly unremarkable in appearance. No pericardial effusion is
identified.

The thyroid gland is unremarkable in appearance; there is mild
nonspecific nodularity of the isthmus of the thyroid. No definite
supraclavicular lymphadenopathy is characterized.

Soft tissue inflammation along the right lateral chest wall is
nonspecific in appearance.

The visualized portions of the liver and the spleen are
unremarkable. The patient is status post cholecystectomy, with clips
noted along the gallbladder fossa. Scattered small nonobstructing
bilateral renal stones are seen, measuring up to 4 mm in size.

A 5.0 cm cyst is noted at the upper pole of the left kidney. The
visualized portions of the pancreas and adrenal glands are within
normal limits. Scattered diverticulosis is noted along the splenic
flexure of the colon.

No acute osseous abnormalities are seen, though evaluation of the
osseous structures is suboptimal due to underlying osteopenia.
IMPRESSION: 1. Large mass at the inferior aspect of the left breast, not fully
imaged on this study. Overlying marked skin thickening, reflecting
inflammatory change. This is compatible with primary breast
malignancy. Mild soft tissue inflammation extends to the midline
anterior chest wall.
2. Additional scattered nodules more superiorly within the left
breast are concerning for local spread of malignancy.
3. Significantly enlarged heterogeneous nodal metastasis at the left
axilla, measuring 5.2 x 3.8 cm, with associated calcification and
irregularity.
4. Partially calcified nodes at the right axilla, measuring up to
2.3 cm in short axis, suspicious for contralateral nodal metastases.
5. Scattered bilateral pulmonary nodules seen measuring up to 1.3 cm
in size, suspicious for metastatic disease the lungs.
6. Right hilar prominence reflects an enlarged 1.6 cm right hilar
node. Additional enlarged mediastinal nodes seen, also suspicious
for metastatic disease.
7. Focal bronchiectasis at the right lower lung lobe, with mild
associated scarring. This may reflect sequelae of prior infection.
Mild bibasilar atelectasis noted.
8. Scattered coronary artery calcification noted.
9. Nonspecific soft tissue inflammation along the right lateral
chest wall.
10. Scattered small nonobstructing bilateral renal stones, measuring
up to 4 mm in size.
11. 5.0 cm left renal cyst seen.
12. Scattered diverticulosis along the splenic flexure of the colon.

These results were called by telephone at the time of interpretation
on 07/09/2014 at [DATE] to Nursing at [[facility]] [facility], who
verbally acknowledged these results.

## 2016-01-05 IMAGING — CR DG CHEST 1V PORT
1 series · 1 of 1 positions shown · non-contrast
Comparison: Prior chest x-ray 07/06/2014

CLINICAL DATA: 84-year-old female with cough, acute hypoxic
respiratory failure and concern for sepsis.

EXAM:
PORTABLE CHEST - 1 VIEW

[ap]
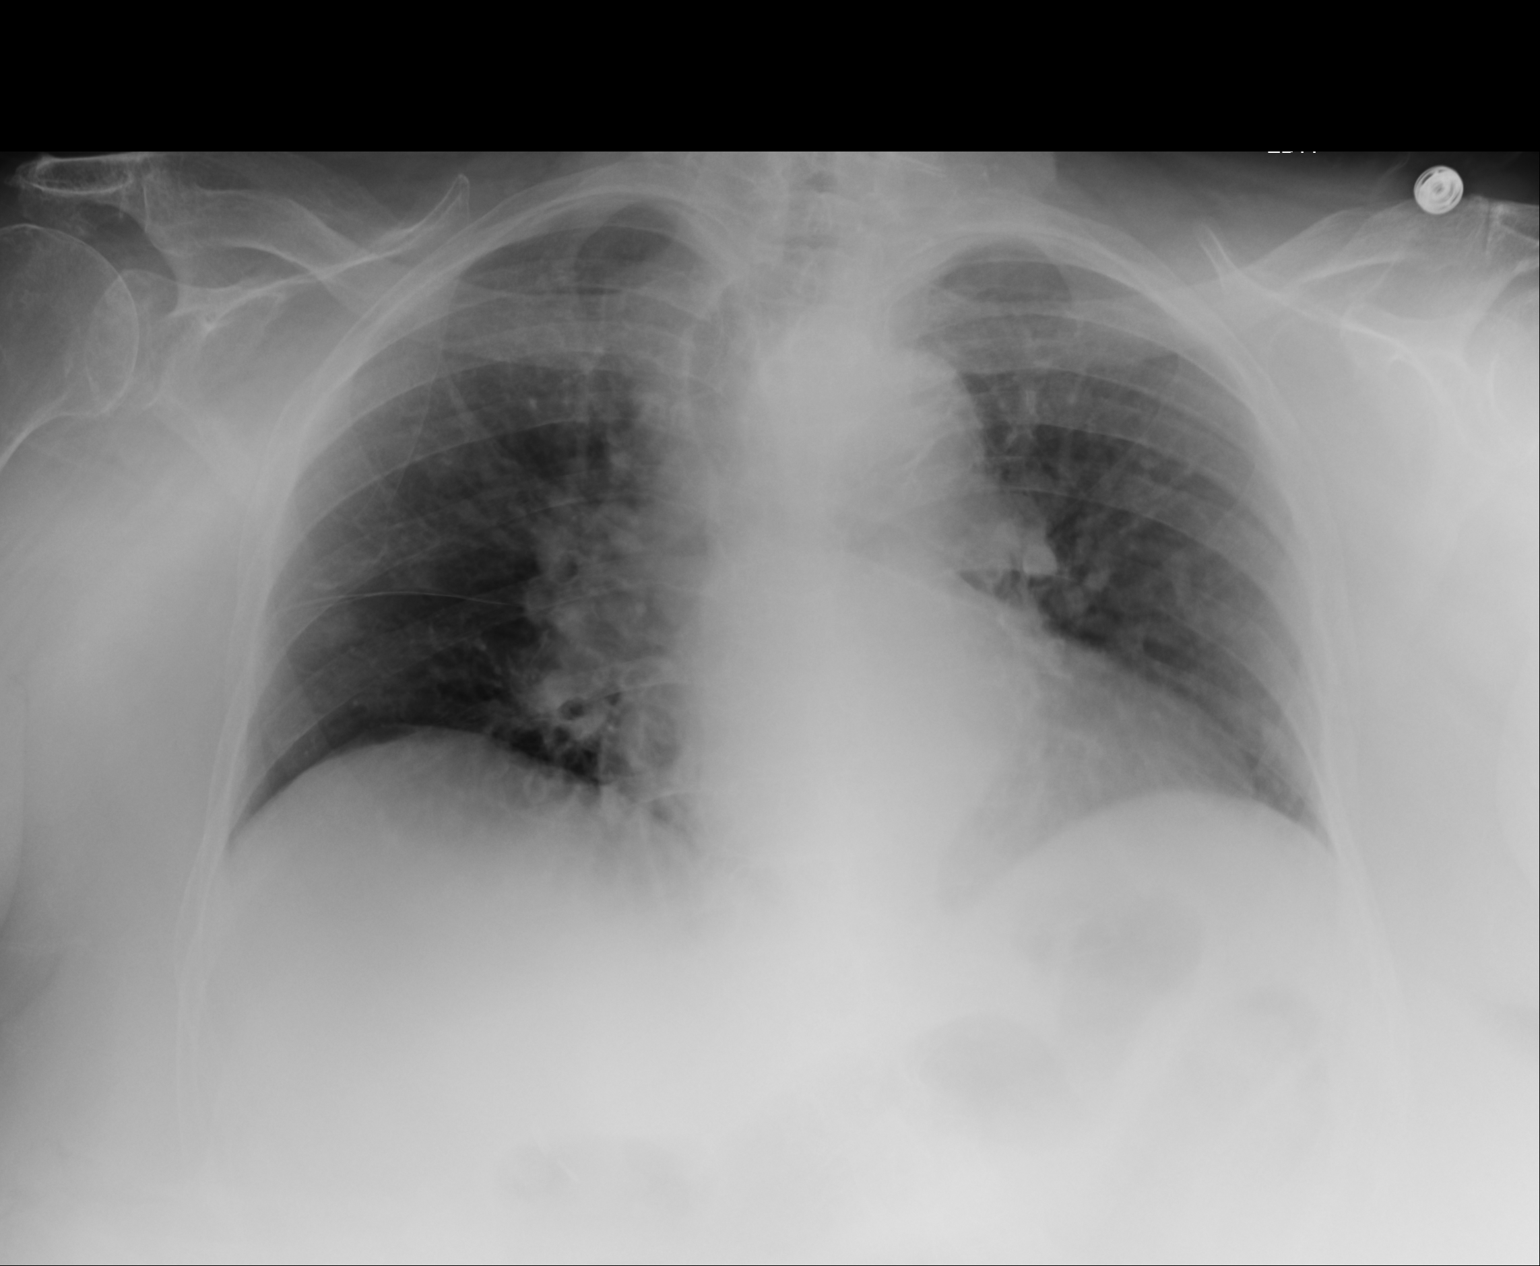

[1 of 1 positions shown; findings below may reference images not displayed]

FINDINGS: Cardiac and mediastinal contours remain unchanged. Atherosclerotic
and tortuous thoracic aorta. Prominence of the right hilum is again
noted. Inspiratory volumes are low. There is mild bibasilar
atelectasis. Pulmonary vascular congestion without overt edema. Is
no acute osseous abnormality.
IMPRESSION: 1. Right perihilar fullness may simply represent prominence of the
vascular structures in the setting of low inspiratory volumes.
Underlying mass or perihilar infiltrate is not excluded. Dedicated
PA and lateral chest x-ray may be helpful for further evaluation.
2. Atherosclerotic and tortuous thoracic aorta.
3. Low inspiratory volumes and pulmonary vascular congestion without
overt edema.
